# Patient Record
Sex: Male | Born: 1960 | Race: Black or African American | Hispanic: No | Marital: Married | Smoking: Never smoker
Health system: Southern US, Community
[De-identification: ages and names within clinical notes are randomized; demographics above are authoritative.]

## PROBLEM LIST (undated history)

## (undated) DIAGNOSIS — I10 Essential (primary) hypertension: Secondary | ICD-10-CM

## (undated) DIAGNOSIS — K635 Polyp of colon: Secondary | ICD-10-CM

## (undated) HISTORY — DX: Essential (primary) hypertension: I10

## (undated) HISTORY — PX: APPENDECTOMY: SHX54

## (undated) HISTORY — PX: SHOULDER SURGERY: SHX246

## (undated) HISTORY — PX: TONSILLECTOMY: SUR1361

## (undated) HISTORY — PX: VASECTOMY: SHX75

---

## 1998-12-02 HISTORY — PX: COLONOSCOPY: SHX174

## 2003-11-12 ENCOUNTER — Ambulatory Visit: Admission: RE | Admit: 2003-11-12 | Discharge: 2003-11-12 | Payer: Self-pay | Admitting: Internal Medicine

## 2004-01-14 ENCOUNTER — Ambulatory Visit (HOSPITAL_COMMUNITY): Admission: RE | Admit: 2004-01-14 | Discharge: 2004-01-14 | Payer: Self-pay | Admitting: Neurosurgery

## 2004-08-16 ENCOUNTER — Ambulatory Visit (HOSPITAL_COMMUNITY): Admission: RE | Admit: 2004-08-16 | Discharge: 2004-08-16 | Payer: Self-pay | Admitting: Family Medicine

## 2004-08-24 ENCOUNTER — Ambulatory Visit: Payer: Self-pay | Admitting: Orthopedic Surgery

## 2004-10-16 ENCOUNTER — Ambulatory Visit: Payer: Self-pay | Admitting: Orthopedic Surgery

## 2010-07-16 ENCOUNTER — Encounter: Payer: Self-pay | Admitting: Internal Medicine

## 2011-10-10 ENCOUNTER — Telehealth: Payer: Self-pay

## 2011-10-10 NOTE — Telephone Encounter (Signed)
Received copy of pt's previous colonoscopy report from [facility]. Done on 12/02/1998 by Dr. Jena Gauss. Tubular adenoma. OV with Tana Coast, PA on 10/17/2011 @ 8:00 AM.

## 2011-10-16 ENCOUNTER — Encounter: Payer: Self-pay | Admitting: Internal Medicine

## 2011-10-17 ENCOUNTER — Ambulatory Visit (INDEPENDENT_AMBULATORY_CARE_PROVIDER_SITE_OTHER): Payer: 59 | Admitting: Gastroenterology

## 2011-10-17 ENCOUNTER — Encounter: Payer: Self-pay | Admitting: Gastroenterology

## 2011-10-17 VITALS — BP 138/88 | HR 60 | Temp 98.0°F | Ht 71.0 in | Wt 219.6 lb

## 2011-10-17 DIAGNOSIS — D126 Benign neoplasm of colon, unspecified: Secondary | ICD-10-CM | POA: Insufficient documentation

## 2011-10-17 MED ORDER — PEG-KCL-NACL-NASULF-NA ASC-C 100 G PO SOLR: 1.0000 | Freq: Once | ORAL | Status: DC

## 2011-10-17 NOTE — Assessment & Plan Note (Signed)
Overdue for surveillance colonoscopy given history of tubular adenoma. Colonoscopy in the near future.  I have discussed the risks, alternatives, benefits with regards to but not limited to the risk of reaction to medication, bleeding, infection, perforation and the patient is agreeable to proceed. Written consent to be obtained.

## 2011-10-17 NOTE — Progress Notes (Signed)
Faxed to PCP

## 2011-10-17 NOTE — Progress Notes (Signed)
Primary Care Physician:  Kirk Ruths, MD, MD  Primary Gastroenterologist:  Roetta Sessions, MD   Chief Complaint  Patient presents with  . Colonoscopy    HPI:  Gregory Quinn is a 51 y.o. male here to schedule a surveillance colonoscopy. He has a history of a tubular adenoma removed back in 2000. Overall he is doing well. He has a history of airway bowel syndrome but states it has been under good control over the last several years with dietary restrictions. He denies any heartburn, vomiting, abdominal pain, weight loss, melena, rectal bleeding.  Current Outpatient Prescriptions  Medication Sig Dispense Refill  . bisoprolol-hydrochlorothiazide (ZIAC) 10-6.25 MG per tablet Take 1 tablet by mouth daily.      . Multiple Vitamin (MULTIVITAMIN) capsule Take 1 capsule by mouth daily.      . verapamil (CALAN-SR) 180 MG CR tablet Take 180 mg by mouth at bedtime.         Allergies as of 10/17/2011  . (No Known Allergies)    Past Medical History  Diagnosis Date  . HTN (hypertension)     Past Surgical History  Procedure Date  . Colonoscopy 12/02/98    normal rectum/single descending colonic diverticulum. Tubular adenoma of distal transverse colon.   . Vasectomy   . Tonsillectomy     Family History  Problem Relation Age of Onset  . Colon cancer Neg Hx   . Liver disease Neg Hx   . Emphysema Father     deceased  . Dementia Mother     History   Social History  . Marital Status: Married    Spouse Name: N/A    Number of Children: 4  . Years of Education: N/A   Occupational History  . police officer     [employer]   Social History Main Topics  . Smoking status: Never Smoker   . Smokeless tobacco: Not on file  . Alcohol Use: No  . Drug Use: No  . Sexually Active: Not on file   Other Topics Concern  . Not on file   Social History Narrative  . No narrative on file      ROS:  General: Negative for anorexia, weight loss, fever, chills, fatigue,  weakness. Eyes: Negative for vision changes.  ENT: Negative for hoarseness, difficulty swallowing , nasal congestion. CV: Negative for chest pain, angina, palpitations, dyspnea on exertion, peripheral edema.  Respiratory: Negative for dyspnea at rest, dyspnea on exertion, cough, sputum, wheezing.  GI: See history of present illness. GU:  Negative for dysuria, hematuria, urinary incontinence, urinary frequency, nocturnal urination.  MS: Negative for joint pain, low back pain.  Derm: Negative for rash or itching.  Neuro: Negative for weakness, abnormal sensation, seizure, frequent headaches, memory loss, confusion.  Psych: Negative for anxiety, depression, suicidal ideation, hallucinations.  Endo: Negative for unusual weight change.  Heme: Negative for bruising or bleeding. Allergy: Negative for rash or hives.    Physical Examination:  BP 138/88  Pulse 60  Temp(Src) 98 F (36.7 C) (Temporal)  Ht 5\' 11"  (1.803 m)  Wt 219 lb 9.6 oz (99.61 kg)  BMI 30.63 kg/m2   General: Well-nourished, well-developed in no acute distress.  Head: Normocephalic, atraumatic.   Eyes: Conjunctiva pink, no icterus. Mouth: Oropharyngeal mucosa moist and pink , no lesions erythema or exudate. Neck: Supple without thyromegaly, masses, or lymphadenopathy.  Lungs: Clear to auscultation bilaterally.  Heart: Regular rate and rhythm, no murmurs rubs or gallops.  Abdomen: Bowel sounds are normal, nontender, nondistended,  no hepatosplenomegaly or masses, no abdominal bruits or    hernia , no rebound or guarding.   Rectal: deferred to time of colonoscopy Extremities: No lower extremity edema. No clubbing or deformities.  Neuro: Alert and oriented x 4 , grossly normal neurologically.  Skin: Warm and dry, no rash or jaundice.   Psych: Alert and cooperative, normal mood and affect.

## 2011-10-17 NOTE — Patient Instructions (Signed)
We have scheduled you for a colonoscopy. See separate instructions.   

## 2011-10-26 ENCOUNTER — Encounter: Payer: Self-pay | Admitting: Internal Medicine

## 2011-11-09 ENCOUNTER — Encounter (HOSPITAL_COMMUNITY): Payer: Self-pay | Admitting: Pharmacy Technician

## 2011-11-15 MED ORDER — SODIUM CHLORIDE 0.45 % IV SOLN
Freq: Once | INTRAVENOUS | Status: AC
Start: 2011-11-15 — End: 2011-11-16
  Administered 2011-11-16: 08:00:00 via INTRAVENOUS

## 2011-11-16 ENCOUNTER — Encounter (HOSPITAL_COMMUNITY)
Admission: RE | Disposition: A | Discharge status: Home / Self Care | Payer: Self-pay | Source: Ambulatory Visit | Attending: Internal Medicine

## 2011-11-16 ENCOUNTER — Encounter (HOSPITAL_COMMUNITY): Payer: Self-pay | Admitting: *Deleted

## 2011-11-16 ENCOUNTER — Ambulatory Visit (HOSPITAL_COMMUNITY)
Admission: RE | Admit: 2011-11-16 | Discharge: 2011-11-16 | Disposition: A | Discharge status: Home / Self Care | Payer: 59 | Source: Ambulatory Visit | Attending: Internal Medicine | Admitting: Internal Medicine

## 2011-11-16 DIAGNOSIS — D126 Benign neoplasm of colon, unspecified: Secondary | ICD-10-CM

## 2011-11-16 DIAGNOSIS — Z1211 Encounter for screening for malignant neoplasm of colon: Secondary | ICD-10-CM

## 2011-11-16 DIAGNOSIS — Z09 Encounter for follow-up examination after completed treatment for conditions other than malignant neoplasm: Secondary | ICD-10-CM | POA: Insufficient documentation

## 2011-11-16 DIAGNOSIS — I10 Essential (primary) hypertension: Secondary | ICD-10-CM | POA: Insufficient documentation

## 2011-11-16 DIAGNOSIS — Z79899 Other long term (current) drug therapy: Secondary | ICD-10-CM | POA: Insufficient documentation

## 2011-11-16 HISTORY — PX: COLONOSCOPY: SHX5424

## 2011-11-16 HISTORY — DX: Polyp of colon: K63.5

## 2011-11-16 SURGERY — COLONOSCOPY
Anesthesia: Moderate Sedation

## 2011-11-16 MED ORDER — MIDAZOLAM HCL 5 MG/5ML IJ SOLN
INTRAMUSCULAR | Status: AC
  Filled 2011-11-16: qty 10

## 2011-11-16 MED ORDER — STERILE WATER FOR IRRIGATION IR SOLN
Status: DC | PRN
  Administered 2011-11-16: 10:00:00

## 2011-11-16 MED ORDER — MIDAZOLAM HCL 5 MG/5ML IJ SOLN
INTRAMUSCULAR | Status: DC | PRN
  Administered 2011-11-16: 1 mg via INTRAVENOUS
  Administered 2011-11-16 (×2): 2 mg via INTRAVENOUS

## 2011-11-16 MED ORDER — MEPERIDINE HCL 100 MG/ML IJ SOLN
INTRAMUSCULAR | Status: AC
  Filled 2011-11-16: qty 1

## 2011-11-16 MED ORDER — MEPERIDINE HCL 100 MG/ML IJ SOLN
INTRAMUSCULAR | Status: DC | PRN
  Administered 2011-11-16: 25 mg via INTRAVENOUS
  Administered 2011-11-16: 50 mg via INTRAVENOUS

## 2011-11-16 NOTE — Op Note (Signed)
[facility] [address] [place], [place]  [address]  COLONOSCOPY PROCEDURE REPORT  PATIENT:  Gregory, Quinn  MR#:  [id number] BIRTHDATE:  [birth year], 51 yrs. old  GENDER:  male ENDOSCOPIST:  R. Roetta Sessions, MD FACP [facility] REF. BY:  Karleen Hampshire, M.D. PROCEDURE DATE:  11/16/2011 PROCEDURE:  Colonoscopy with snare polypectomy  INDICATIONS:  Distant history of colonic adenoma; overdue for surveillance examination.  INFORMED CONSENT:  The risks, benefits, alternatives and imponderables including but not limited to bleeding, perforation as well as the possibility of a missed lesion have been reviewed. The potential for biopsy, lesion removal, etc. have also been discussed.  Questions have been answered.  All parties agreeable. Please see the history and physical in the medical record for more information.  MEDICATIONS:  Versed 5 mg IV and Demerol 75 mg IV in divided doses.  DESCRIPTION OF PROCEDURE:  After a digital rectal exam was performed, the EC-[id number] ([id number]) colonoscope was advanced from the anus through the rectum and colon to the area of the cecum, ileocecal valve and appendiceal orifice.  The cecum was deeply intubated.  These structures were well-seen and photographed for the record.  From the level of the cecum and ileocecal valve, the scope was slowly and cautiously withdrawn.  The mucosal surfaces were carefully surveyed utilizing scope tip deflection to facilitate fold flattening as needed.  The scope was pulled down into the rectum where a thorough examination including retroflexion was performed. <<PROCEDUREIMAGES>>  FINDINGS: The preparation. Normal rectum. 1.2 cm pedunculated polyp in the mid sigmoid colon; otherwise the remainder of colonic mucosa                      appeared normal.  THERAPEUTIC / DIAGNOSTIC MANEUVERS PERFORMED: The above-mentioned polyp was hot snared and removed cleanly with one pass.  COMPLICATIONS:  None  CECAL  WITHDRAWAL TIME:   11 minutes  IMPRESSION:   Colonic polyp-removed as described above  RECOMMENDATIONS:  Followup on pathology  ______________________________ R. Roetta Sessions, MD Caleen Essex  CC:  Karleen Hampshire, M.D.  n. eSIGNED:   R. Roetta Sessions at 11/16/2011 10:02 AM  Peyton Najjar, [phone]

## 2011-11-16 NOTE — H&P (View-Only) (Signed)
Primary Care Physician:  MCGOUGH,WILLIAM M, MD, MD  Primary Gastroenterologist:  Michael Rourk, MD   Chief Complaint  Patient presents with  . Colonoscopy    HPI:  Gregory Quinn is a 51 y.o. male here to schedule a surveillance colonoscopy. He has a history of a tubular adenoma removed back in 2000. Overall he is doing well. He has a history of airway bowel syndrome but states it has been under good control over the last several years with dietary restrictions. He denies any heartburn, vomiting, abdominal pain, weight loss, melena, rectal bleeding.  Current Outpatient Prescriptions  Medication Sig Dispense Refill  . bisoprolol-hydrochlorothiazide (ZIAC) 10-6.25 MG per tablet Take 1 tablet by mouth daily.      . Multiple Vitamin (MULTIVITAMIN) capsule Take 1 capsule by mouth daily.      . verapamil (CALAN-SR) 180 MG CR tablet Take 180 mg by mouth at bedtime.         Allergies as of 10/17/2011  . (No Known Allergies)    Past Medical History  Diagnosis Date  . HTN (hypertension)     Past Surgical History  Procedure Date  . Colonoscopy 12/02/98    normal rectum/single descending colonic diverticulum. Tubular adenoma of distal transverse colon.   . Vasectomy   . Tonsillectomy     Family History  Problem Relation Age of Onset  . Colon cancer Neg Hx   . Liver disease Neg Hx   . Emphysema Father     deceased  . Dementia Mother     History   Social History  . Marital Status: Married    Spouse Name: N/A    Number of Children: 4  . Years of Education: N/A   Occupational History  . police officer     [employer]   Social History Main Topics  . Smoking status: Never Smoker   . Smokeless tobacco: Not on file  . Alcohol Use: No  . Drug Use: No  . Sexually Active: Not on file   Other Topics Concern  . Not on file   Social History Narrative  . No narrative on file      ROS:  General: Negative for anorexia, weight loss, fever, chills, fatigue,  weakness. Eyes: Negative for vision changes.  ENT: Negative for hoarseness, difficulty swallowing , nasal congestion. CV: Negative for chest pain, angina, palpitations, dyspnea on exertion, peripheral edema.  Respiratory: Negative for dyspnea at rest, dyspnea on exertion, cough, sputum, wheezing.  GI: See history of present illness. GU:  Negative for dysuria, hematuria, urinary incontinence, urinary frequency, nocturnal urination.  MS: Negative for joint pain, low back pain.  Derm: Negative for rash or itching.  Neuro: Negative for weakness, abnormal sensation, seizure, frequent headaches, memory loss, confusion.  Psych: Negative for anxiety, depression, suicidal ideation, hallucinations.  Endo: Negative for unusual weight change.  Heme: Negative for bruising or bleeding. Allergy: Negative for rash or hives.    Physical Examination:  BP 138/88  Pulse 60  Temp(Src) 98 F (36.7 C) (Temporal)  Ht 5' 11" (1.803 m)  Wt 219 lb 9.6 oz (99.61 kg)  BMI 30.63 kg/m2   General: Well-nourished, well-developed in no acute distress.  Head: Normocephalic, atraumatic.   Eyes: Conjunctiva pink, no icterus. Mouth: Oropharyngeal mucosa moist and pink , no lesions erythema or exudate. Neck: Supple without thyromegaly, masses, or lymphadenopathy.  Lungs: Clear to auscultation bilaterally.  Heart: Regular rate and rhythm, no murmurs rubs or gallops.  Abdomen: Bowel sounds are normal, nontender, nondistended,   no hepatosplenomegaly or masses, no abdominal bruits or    hernia , no rebound or guarding.   Rectal: deferred to time of colonoscopy Extremities: No lower extremity edema. No clubbing or deformities.  Neuro: Alert and oriented x 4 , grossly normal neurologically.  Skin: Warm and dry, no rash or jaundice.   Psych: Alert and cooperative, normal mood and affect.   

## 2011-11-16 NOTE — Discharge Instructions (Addendum)
Colonoscopy Discharge Instructions  Read the instructions outlined below and refer to this sheet in the next few weeks. These discharge instructions provide you with general information on caring for yourself after you leave the hospital. Your doctor may also give you specific instructions. While your treatment has been planned according to the most current medical practices available, unavoidable complications occasionally occur. If you have any problems or questions after discharge, call Dr. Rourk at [phone]. ACTIVITY  You may resume your regular activity, but move at a slower pace for the next 24 hours.   Take frequent rest periods for the next 24 hours.   Walking will help get rid of the air and reduce the bloated feeling in your belly (abdomen).   No driving for 24 hours (because of the medicine (anesthesia) used during the test).    Do not sign any important legal documents or operate any machinery for 24 hours (because of the anesthesia used during the test).  NUTRITION  Drink plenty of fluids.   You may resume your normal diet as instructed by your doctor.   Begin with a light meal and progress to your normal diet. Heavy or fried foods are harder to digest and may make you feel sick to your stomach (nauseated).   Avoid alcoholic beverages for 24 hours or as instructed.  MEDICATIONS  You may resume your normal medications unless your doctor tells you otherwise.  WHAT YOU CAN EXPECT TODAY  Some feelings of bloating in the abdomen.   Passage of more gas than usual.   Spotting of blood in your stool or on the toilet paper.  IF YOU HAD POLYPS REMOVED DURING THE COLONOSCOPY:  No aspirin products for 7 days or as instructed.   No alcohol for 7 days or as instructed.   Eat a soft diet for the next 24 hours.  FINDING OUT THE RESULTS OF YOUR TEST Not all test results are available during your visit. If your test results are not back during the visit, make an appointment  with your caregiver to find out the results. Do not assume everything is normal if you have not heard from your caregiver or the medical facility. It is important for you to follow up on all of your test results.  SEEK IMMEDIATE MEDICAL ATTENTION IF:  You have more than a spotting of blood in your stool.   Your belly is swollen (abdominal distention).   You are nauseated or vomiting.   You have a temperature over 101.   You have abdominal pain or discomfort that is severe or gets worse throughout the day.    Polyp information provided.  Further recommendations to follow pending review of pathology report.  Colon Polyps A polyp is extra tissue that grows inside your body. Colon polyps grow in the large intestine. The large intestine, also called the colon, is part of your digestive system. It is a long, hollow tube at the end of your digestive tract where your body makes and stores stool. Most polyps are not dangerous. They are benign. This means they are not cancerous. But over time, some types of polyps can turn into cancer. Polyps that are smaller than a pea are usually not harmful. But larger polyps could someday become or may already be cancerous. To be safe, doctors remove all polyps and test them.  WHO GETS POLYPS? Anyone can get polyps, but certain people are more likely than others. You may have a greater chance of getting polyps if:    You are over 50.   You have had polyps before.   Someone in your family has had polyps.   Someone in your family has had cancer of the large intestine.   Find out if someone in your family has had polyps. You may also be more likely to get polyps if you:   Eat a lot of fatty foods.   Smoke.   Drink alcohol.   Do not exercise.   Eat too much.  SYMPTOMS  Most small polyps do not cause symptoms. People often do not know they have one until their caregiver finds it during a regular checkup or while testing them for something else. Some  people do have symptoms like these:  Bleeding from the anus. You might notice blood on your underwear or on toilet paper after you have had a bowel movement.   Constipation or diarrhea that lasts more than a week.   Blood in the stool. Blood can make stool look black or it can show up as red streaks in the stool.  If you have any of these symptoms, see your caregiver. HOW DOES THE DOCTOR TEST FOR POLYPS? The doctor can use four tests to check for polyps:  Digital rectal exam. The caregiver wears gloves and checks your rectum (the last part of the large intestine) to see if it feels normal. This test would find polyps only in the rectum. Your caregiver may need to do one of the other tests listed below to find polyps higher up in the intestine.   Barium enema. The caregiver puts a liquid called barium into your rectum before taking x-rays of your large intestine. Barium makes your intestine look white in the pictures. Polyps are dark, so they are easy to see.   Sigmoidoscopy. With this test, the caregiver can see inside your large intestine. A thin flexible tube is placed into your rectum. The device is called a sigmoidoscope, which has a light and a tiny video camera in it. The caregiver uses the sigmoidoscope to look at the last third of your large intestine.   Colonoscopy. This test is like sigmoidoscopy, but the caregiver looks at all of the large intestine. It usually requires sedation. This is the most common method for finding and removing polyps.  TREATMENT   The caregiver will remove the polyp during sigmoidoscopy or colonoscopy. The polyp is then tested for cancer.   If you have had polyps, your caregiver may want you to get tested regularly in the future.  PREVENTION  There is not one sure way to prevent polyps. You might be able to lower your risk of getting them if you:  Eat more fruits and vegetables and less fatty food.   Do not smoke.   Avoid alcohol.   Exercise every  day.   Lose weight if you are overweight.   Eating more calcium and folate can also lower your risk of getting polyps. Some foods that are rich in calcium are milk, cheese, and broccoli. Some foods that are rich in folate are chickpeas, kidney beans, and spinach.   Aspirin might help prevent polyps. Studies are under way.  Document Released: 03/07/2004 Document Revised: 05/31/2011 Document Reviewed: 08/13/2007 [employer] Patient Information 2012 [employer]. 

## 2011-11-16 NOTE — Interval H&P Note (Signed)
History and Physical Interval Note:  11/16/2011 9:29 AM  Gregory Quinn  has presented today for surgery, with the diagnosis of hx of polyps  The various methods of treatment have been discussed with the patient and family. After consideration of risks, benefits and other options for treatment, the patient has consented to  Procedure(s) (LRB): COLONOSCOPY (N/A) as a surgical intervention .  The patients' history has been reviewed, patient examined, no change in status, stable for surgery.  I have reviewed the patients' chart and labs.  Questions were answered to the patient's satisfaction.     [name]

## 2011-11-21 ENCOUNTER — Encounter (HOSPITAL_COMMUNITY): Payer: Self-pay | Admitting: Internal Medicine

## 2011-11-24 ENCOUNTER — Encounter: Payer: Self-pay | Admitting: Internal Medicine

## 2014-01-07 ENCOUNTER — Ambulatory Visit: Payer: Self-pay

## 2014-01-07 ENCOUNTER — Other Ambulatory Visit: Payer: Self-pay | Admitting: Occupational Medicine

## 2014-01-07 DIAGNOSIS — R52 Pain, unspecified: Secondary | ICD-10-CM

## 2014-11-09 ENCOUNTER — Encounter: Payer: Self-pay | Admitting: Internal Medicine

## 2020-07-31 ENCOUNTER — Other Ambulatory Visit: Payer: Self-pay

## 2020-07-31 ENCOUNTER — Emergency Department (HOSPITAL_COMMUNITY): Payer: 59

## 2020-07-31 ENCOUNTER — Encounter (HOSPITAL_COMMUNITY): Payer: Self-pay | Admitting: *Deleted

## 2020-07-31 ENCOUNTER — Emergency Department (HOSPITAL_COMMUNITY)
Admission: EM | Admit: 2020-07-31 | Discharge: 2020-08-01 | Disposition: A | Discharge status: Home / Self Care | Payer: 59 | Attending: Emergency Medicine | Admitting: Emergency Medicine

## 2020-07-31 DIAGNOSIS — M79604 Pain in right leg: Secondary | ICD-10-CM | POA: Insufficient documentation

## 2020-07-31 DIAGNOSIS — R079 Chest pain, unspecified: Secondary | ICD-10-CM

## 2020-07-31 DIAGNOSIS — I10 Essential (primary) hypertension: Secondary | ICD-10-CM | POA: Insufficient documentation

## 2020-07-31 DIAGNOSIS — R0789 Other chest pain: Secondary | ICD-10-CM | POA: Diagnosis not present

## 2020-07-31 DIAGNOSIS — M79605 Pain in left leg: Secondary | ICD-10-CM | POA: Insufficient documentation

## 2020-07-31 LAB — CBC
HCT: 46.2 % (ref 39.0–52.0)
Hemoglobin: 14.6 g/dL (ref 13.0–17.0)
MCH: 30.5 pg (ref 26.0–34.0)
MCHC: 31.6 g/dL (ref 30.0–36.0)
MCV: 96.5 fL (ref 80.0–100.0)
Platelets: 194 10*3/uL (ref 150–400)
RBC: 4.79 MIL/uL (ref 4.22–5.81)
RDW: 13.1 % (ref 11.5–15.5)
WBC: 6.3 10*3/uL (ref 4.0–10.5)
nRBC: 0 % (ref 0.0–0.2)

## 2020-07-31 LAB — BASIC METABOLIC PANEL
Anion gap: 8 (ref 5–15)
BUN: 11 mg/dL (ref 6–20)
CO2: 29 mmol/L (ref 22–32)
Calcium: 9.2 mg/dL (ref 8.9–10.3)
Chloride: 104 mmol/L (ref 98–111)
Creatinine, Ser: 1.09 mg/dL (ref 0.61–1.24)
GFR, Estimated: >60 mL/min (ref >60)
Glucose, Bld: 115 mg/dL — ABNORMAL HIGH (ref 70–99)
Potassium: 3.5 mmol/L (ref 3.5–5.1)
Sodium: 141 mmol/L (ref 135–145)

## 2020-07-31 LAB — TROPONIN I (HIGH SENSITIVITY): Troponin I (High Sensitivity): 3 ng/L (ref <18)

## 2020-07-31 NOTE — ED Notes (Signed)
During assessment with Pt, Pt reports removing flooring on Thursday this week with a friend and feeling sore afterwards. Pt also reports lift heavy equipment and air compressor into back of truck later in even as well. Pt denies any other injury or hearing a pop. Pt has Hx of left shoulder surgery and reports his pain is currently localized to left pectoralis majoris. Pain worsens with movement of LUE.

## 2020-07-31 NOTE — ED Triage Notes (Signed)
Pt with left sided cp since Thursday, pt states a lot more spasms now. Pt believes it's a pulled a muscle.  Pt was taking up a floor when pain started.

## 2020-08-01 LAB — TROPONIN I (HIGH SENSITIVITY): Troponin I (High Sensitivity): 3 ng/L (ref <18)

## 2020-08-01 MED ORDER — KETOROLAC TROMETHAMINE 30 MG/ML IJ SOLN
15.0000 mg | Freq: Once | INTRAMUSCULAR | Status: AC
  Administered 2020-08-01: 15 mg via INTRAVENOUS
  Filled 2020-08-01: qty 1

## 2020-08-01 MED ORDER — CYCLOBENZAPRINE HCL 10 MG PO TABS: 10.0000 mg | ORAL_TABLET | Freq: Two times a day (BID) | ORAL | 0 refills | Status: DC | PRN

## 2020-08-01 MED ORDER — IBUPROFEN 400 MG PO TABS: 400.0000 mg | ORAL_TABLET | Freq: Three times a day (TID) | ORAL | 0 refills | Status: AC

## 2020-08-01 MED ORDER — DIAZEPAM 2 MG PO TABS
2.0000 mg | ORAL_TABLET | Freq: Once | ORAL | Status: AC
  Administered 2020-08-01: 2 mg via ORAL
  Filled 2020-08-01: qty 1

## 2020-08-01 NOTE — ED Provider Notes (Signed)
Emergency Department Provider Note  I have reviewed the triage vital signs and the nursing notes.  HISTORY  Chief Complaint Chest Pain   HPI Gregory Quinn is a 60 y.o. male with a history of hypertension but no other medical problems presents the emergency department today with chest pain.  Patient relates that he was pulling a bunch of floor and lifting heavy things a few days ago.  Shortly after this he started having some bilateral lower extremity pain and chest pain.  He describes both of them as spasms and cramps.  He states he does not normally do much physical activity.  Patient now is having some central to slightly to the left chest pain worse with movement, palpation and movement of his arms.  No shortness of breath, nausea, lightheadedness or diaphoresis.  Patient states that when he lays still he has no symptoms at all but since he starts to move it hurts again.  No lower extremity swelling.  No fever cough.  Has not tried any for the symptoms.   No other associated or modifying symptoms.    Past Medical History:  Diagnosis Date  . Colon polyps   . HTN (hypertension)     Patient Active Problem List   Diagnosis Date Noted  . Tubular adenoma of colon 10/17/2011    Past Surgical History:  Procedure Laterality Date  . APPENDECTOMY    . COLONOSCOPY  12/02/98   normal rectum/single descending colonic diverticulum. Tubular adenoma of distal transverse colon.   . COLONOSCOPY  11/16/2011   Procedure: COLONOSCOPY;  Surgeon: Daneil Dolin, MD;  Location: [facility];  Service: Endoscopy;  Laterality: N/A;  9:15  . SHOULDER SURGERY    . TONSILLECTOMY    . VASECTOMY      Current Outpatient Rx  . Order #: [id number] Class: Print  . Order #: [id number] Class: Print  . Order #: [id number] Class: Historical Med  . Order #: [id number] Class: Historical Med  . Order #: [id number] Class: Print  . Order #: [id number] Class: Historical Med    Allergies Patient has no known  allergies.  Family History  Problem Relation Age of Onset  . Colon cancer Neg Hx   . Liver disease Neg Hx   . Emphysema Father        deceased  . Dementia Mother     Social History Social History   Tobacco Use  . Smoking status: Never Smoker  . Smokeless tobacco: Never Used  Substance Use Topics  . Alcohol use: No  . Drug use: No    Review of Systems  All other systems negative except as documented in the HPI. All pertinent positives and negatives as reviewed in the HPI. ____________________________________________  PHYSICAL EXAM:  VITAL SIGNS: ED Triage Vitals  Enc Vitals Group     BP 07/31/20 2259 (!) 158/81     Pulse Rate 07/31/20 2259 75     Resp 07/31/20 2259 12     Temp 07/31/20 2259 98.3 F (36.8 C)     Temp Source 07/31/20 2259 Oral     SpO2 07/31/20 2259 97 %     Weight 07/31/20 2255 228 lb (103.4 kg)     Height 07/31/20 2255 5\' 11"  (1.803 m)    Constitutional: Alert and oriented. Well appearing and in no acute distress. Eyes: Conjunctivae are normal. PERRL. EOMI. Head: Atraumatic. Nose: No congestion/rhinnorhea. Mouth/Throat: Mucous membranes are moist.  Oropharynx non-erythematous. Neck: No stridor.  No meningeal signs.   Cardiovascular: Normal rate, regular  rhythm. Good peripheral circulation. Grossly normal heart sounds.   Respiratory: Normal respiratory effort.  No retractions. Lungs CTAB. Gastrointestinal: Soft and nontender. No distention.  Musculoskeletal: No lower extremity tenderness nor edema. No gross deformities of extremities.  Tenderness over left pectoral and sternum. Neurologic:  Normal speech and language. No gross focal neurologic deficits are appreciated.  Skin:  Skin is warm, dry and intact. No rash noted.  ____________________________________________   LABS (all labs ordered are listed, but only abnormal results are displayed)  Labs Reviewed  BASIC METABOLIC PANEL - Abnormal; Notable for the following components:       Result Value   Glucose, Bld 115 (*)    All other components within normal limits  CBC  TROPONIN I (HIGH SENSITIVITY)  TROPONIN I (HIGH SENSITIVITY)   ____________________________________________  EKG   EKG Interpretation  Date/Time:  Sunday July 31 2020 22:49:46 EST Ventricular Rate:  76 PR Interval:  172 QRS Duration: 88 QT Interval:  372 QTC Calculation: 418 R Axis:   -140 Text Interpretation: Normal sinus rhythm Right superior axis deviation Possible Right ventricular hypertrophy Abnormal ECG No old tracing to compare Confirmed by Merrily Pew [phone]) on 08/01/2020 12:28:46 AM      ____________________________________________  RADIOLOGY  DG Chest Portable 1 View  Result Date: 07/31/2020 CLINICAL DATA:  Chest pain since Thursday lesions pole muscle. EXAM: PORTABLE CHEST 1 VIEW COMPARISON:  Chest radiograph December 22, 2016 FINDINGS: The heart size and mediastinal contours are within normal limits. Linear band in the mid right lung and left lower lung, likely subsegmental atelectasis. No focal consolidation. No pleural effusion. No pneumothorax. The visualized skeletal structures are unremarkable. IMPRESSION: 1. Minimal subsegmental atelectasis versus scarring in the mid right lung and left lower lung. 2. No acute cardiopulmonary abnormality. Electronically Signed   By: Dahlia Bailiff MD   On: 07/31/2020 23:56   ____________________________________________  PROCEDURES  Procedure(s) performed:   Procedures ____________________________________________  INITIAL IMPRESSION / ASSESSMENT AND PLAN    This patient presents to the ED for concern of chest pain, this involves an extensive number of treatment options, and is a complaint that carries with it a high risk of complications and morbidity.  The initial differential diagnosis includes ACS, PE, pneumonia, muscle spasm, costochondritis.  I suspect that it is muscle strain/spasm as the most likely cause with the movement  exacerbating pain.  However he is 60 years old history of hypertension so we will do delta troponins.  Additional history obtained:   Additional history obtained from his wife at bedside  Previous records obtained and reviewed in epic  ED Course  Clinical Course as of 08/01/20 0452  [facility] Aug 01, 2020  0038 Troponin I (High Sensitivity): 3 Reassuring, pending second [JM]  0038 WBC: 6.3 [JM]  0038 BP(!): 158/83 [JM]  0038 BP(!): 158/81 [JM]  0039 DG Chest Portable 1 View Doubt pneumonia, covid, pneumothorax, occult rib fx possible? [JM]  0227 Patient sleeping. Feels much better. Will await second troponin.  [JM]    Clinical Course User Index [JM] Armany Mano, Corene Cornea, MD     Medicines ordered:  Medications  ketorolac (TORADOL) 30 MG/ML injection 15 mg (15 mg Intravenous Given 08/01/20 0053)  diazepam (VALIUM) tablet 2 mg (2 mg Oral Given 08/01/20 0053)    Consultations Obtained:   I consulted noone  and discussed lab and imaging findings   CRITICAL INTERVENTIONS:  . none  Reevaluation:  After the interventions stated above, I reevaluated the patient and found pain free, no  new symptoms. Stable for discharge for suspected msk strain. pcp follow up.   FINAL IMPRESSION AND PLAN Final diagnoses:  Nonspecific chest pain   A medical screening exam was performed and I feel the patient has had an appropriate workup for their chief complaint at this time and likelihood of emergent condition existing is low. They have been counseled on decision, DISCHARGE, follow up and which symptoms necessitate immediate return to the emergency department. They or their family verbally stated understanding and agreement with plan and discharged in stable condition.   ____________________________________________   NEW OUTPATIENT MEDICATIONS STARTED DURING THIS VISIT:  Discharge Medication List as of 08/01/2020  2:42 AM    START taking these medications   Details  cyclobenzaprine (FLEXERIL) 10 MG  tablet Take 1 tablet (10 mg total) by mouth 2 (two) times daily as needed for muscle spasms., Starting Mon 08/01/2020, Print    ibuprofen (ADVIL) 400 MG tablet Take 1 tablet (400 mg total) by mouth 3 (three) times daily for 7 days., Starting Mon 08/01/2020, Until Mon 08/08/2020, Print        Note:  This note was prepared with assistance of Dragon voice recognition software. Occasional wrong-word or sound-a-like substitutions may have occurred due to the inherent limitations of voice recognition software.   Renuka Farfan, Corene Cornea, MD 08/01/20 [phone]

## 2021-07-12 IMAGING — DX DG CHEST 1V PORT
1 series · 1 of 1 positions shown · non-contrast
Comparison: Chest radiograph December 22, 2016

CLINICAL DATA: Chest pain since [REDACTED] lesions pole muscle.

EXAM:
PORTABLE CHEST 1 VIEW

[chest ap]
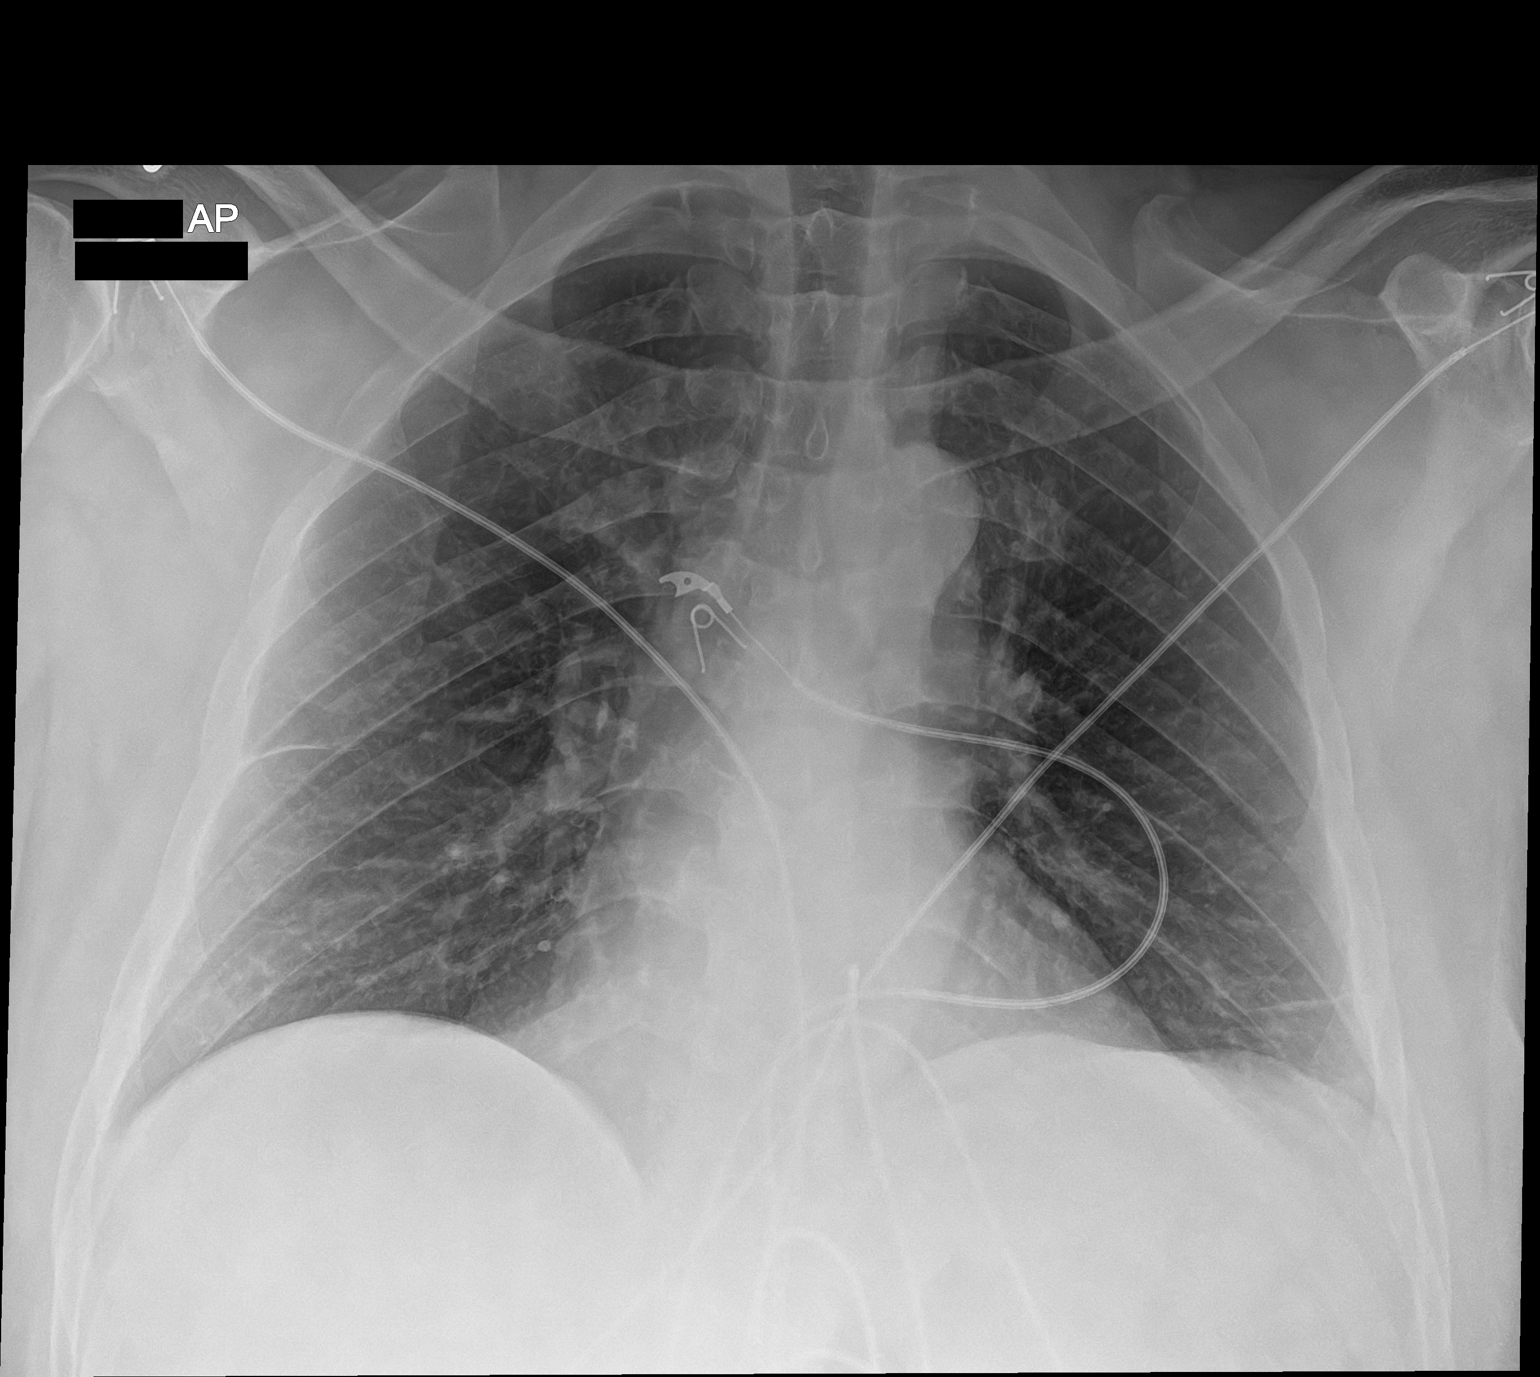

[1 of 1 positions shown; findings below may reference images not displayed]

FINDINGS: The heart size and mediastinal contours are within normal limits.
Linear band in the mid right lung and left lower lung, likely
subsegmental atelectasis. No focal consolidation. No pleural
effusion. No pneumothorax. The visualized skeletal structures are
unremarkable.
IMPRESSION: 1. Minimal subsegmental atelectasis versus scarring in the mid right
lung and left lower lung.
2. No acute cardiopulmonary abnormality.

## 2023-01-28 ENCOUNTER — Encounter: Payer: Self-pay | Admitting: *Deleted

## 2023-02-07 ENCOUNTER — Telehealth: Payer: Self-pay | Admitting: Internal Medicine

## 2023-02-07 DIAGNOSIS — Z1211 Encounter for screening for malignant neoplasm of colon: Secondary | ICD-10-CM

## 2023-02-07 NOTE — Telephone Encounter (Signed)
Questionnaire is in review.

## 2023-02-08 NOTE — Addendum Note (Signed)
Addended by: Armstead Peaks on: 02/08/2023 08:39 AM   Modules accepted: Orders

## 2023-02-08 NOTE — Telephone Encounter (Signed)
  Procedure: Colonoscopy  Height: 5'11 Weight: 234lbs       Have you had a colonoscopy before?  Dr. Jena Gauss, 2013  Do you have family history of colon cancer?  no  Do you have a family history of polyps? no  Previous colonoscopy with polyps removed? no  Do you have a history colorectal cancer?   no  Are you diabetic?  no  Do you have a prosthetic or mechanical heart valve? no  Do you have a pacemaker/defibrillator?   no  Have you had endocarditis/atrial fibrillation?  no  Do you use supplemental oxygen/CPAP?  yes cpap  Have you had joint replacement within the last 12 months?  no  Do you tend to be constipated or have to use laxatives?  no   Do you have history of alcohol use? If yes, how much and how often.  no  Do you have history or are you using drugs? If yes, what do are you  using?  no  Have you ever had a stroke/heart attack?  no  Have you ever had a heart or other vascular stent placed,?no  Do you take weight loss medication? no  Do you take any blood-thinning medications such as: (Plavix, aspirin, Coumadin, Aggrenox, Brilinta, Xarelto, Eliquis, Pradaxa, Savaysa or Effient)? no  If yes we need the name, milligram, dosage and who is prescribing doctor:               Current Outpatient Medications  Medication Sig Dispense Refill   amLODipine (NORVASC) 10 MG tablet Take 10 mg by mouth daily.     aspirin EC 81 MG tablet Take 81 mg by mouth daily. Swallow whole.     atorvastatin (LIPITOR) 10 MG tablet Take 10 mg by mouth at bedtime.     bisoprolol-hydrochlorothiazide (ZIAC) 10-6.25 MG per tablet Take 1 tablet by mouth daily.     Cholecalciferol (VITAMIN D) 125 MCG (5000 UT) CAPS Take by mouth daily.     ferrous sulfate 325 (65 FE) MG EC tablet Take 325 mg by mouth daily with breakfast.     Glucosamine-Chondroit-Vit C-Mn (GLUCOSAMINE 1500 COMPLEX PO) Take by mouth. 2 daily     Multiple Vitamins-Minerals (CENTRUM SILVER PO) Take by mouth daily.     omega-3 acid  ethyl esters (LOVAZA) 1 g capsule Take by mouth 2 (two) times daily.     No current facility-administered medications for this visit.    Allergies  Allergen Reactions   Oxycodone

## 2023-03-18 MED ORDER — PEG 3350-KCL-NA BICARB-NACL 420 G PO SOLR: 4000.0000 mL | Freq: Once | ORAL | 0 refills | Status: AC

## 2023-03-18 NOTE — Telephone Encounter (Signed)
Spoke with pt. Scheduled for 10/10. Aware will send instructions. Aware to hold iron x 7 days prior. Bmet entered. Rx for prep sent.

## 2023-03-18 NOTE — Addendum Note (Signed)
Addended by: Armstead Peaks on: 03/18/2023 08:10 AM   Modules accepted: Orders

## 2023-03-25 ENCOUNTER — Other Ambulatory Visit (HOSPITAL_COMMUNITY)
Admission: RE | Admit: 2023-03-25 | Discharge: 2023-03-25 | Disposition: A | Discharge status: Home / Self Care | Payer: 59 | Source: Ambulatory Visit | Attending: Internal Medicine | Admitting: Internal Medicine

## 2023-03-25 DIAGNOSIS — Z1211 Encounter for screening for malignant neoplasm of colon: Secondary | ICD-10-CM | POA: Insufficient documentation

## 2023-03-25 LAB — BASIC METABOLIC PANEL
Anion gap: 10 (ref 5–15)
BUN: 11 mg/dL (ref 8–23)
CO2: 28 mmol/L (ref 22–32)
Calcium: 8.9 mg/dL (ref 8.9–10.3)
Chloride: 101 mmol/L (ref 98–111)
Creatinine, Ser: 1.1 mg/dL (ref 0.61–1.24)
GFR, Estimated: >60 mL/min (ref >60)
Glucose, Bld: 99 mg/dL (ref 70–99)
Potassium: 3.7 mmol/L (ref 3.5–5.1)
Sodium: 139 mmol/L (ref 135–145)

## 2023-03-26 ENCOUNTER — Encounter: Payer: Self-pay | Admitting: *Deleted

## 2023-03-26 NOTE — Telephone Encounter (Signed)
Referral completed, [facility] apt letter sent to PCP

## 2023-04-04 ENCOUNTER — Ambulatory Visit (HOSPITAL_COMMUNITY): Payer: 59 | Admitting: Anesthesiology

## 2023-04-04 ENCOUNTER — Encounter (HOSPITAL_COMMUNITY): Payer: Self-pay | Admitting: Internal Medicine

## 2023-04-04 ENCOUNTER — Ambulatory Visit (HOSPITAL_COMMUNITY)
Admission: RE | Admit: 2023-04-04 | Discharge: 2023-04-04 | Disposition: A | Discharge status: Home / Self Care | Payer: 59 | Attending: Internal Medicine | Admitting: Internal Medicine

## 2023-04-04 ENCOUNTER — Encounter (HOSPITAL_COMMUNITY)
Admission: RE | Disposition: A | Discharge status: Home / Self Care | Payer: Self-pay | Source: Home / Self Care | Attending: Internal Medicine

## 2023-04-04 ENCOUNTER — Other Ambulatory Visit: Payer: Self-pay

## 2023-04-04 ENCOUNTER — Ambulatory Visit (HOSPITAL_BASED_OUTPATIENT_CLINIC_OR_DEPARTMENT_OTHER): Payer: 59 | Admitting: Anesthesiology

## 2023-04-04 DIAGNOSIS — I10 Essential (primary) hypertension: Secondary | ICD-10-CM | POA: Insufficient documentation

## 2023-04-04 DIAGNOSIS — Z860100 Personal history of colon polyps, unspecified: Secondary | ICD-10-CM | POA: Insufficient documentation

## 2023-04-04 DIAGNOSIS — K649 Unspecified hemorrhoids: Secondary | ICD-10-CM | POA: Diagnosis not present

## 2023-04-04 DIAGNOSIS — Z1211 Encounter for screening for malignant neoplasm of colon: Secondary | ICD-10-CM | POA: Diagnosis present

## 2023-04-04 DIAGNOSIS — D124 Benign neoplasm of descending colon: Secondary | ICD-10-CM | POA: Insufficient documentation

## 2023-04-04 DIAGNOSIS — K635 Polyp of colon: Secondary | ICD-10-CM

## 2023-04-04 DIAGNOSIS — Z860101 Personal history of adenomatous and serrated colon polyps: Secondary | ICD-10-CM

## 2023-04-04 DIAGNOSIS — K640 First degree hemorrhoids: Secondary | ICD-10-CM | POA: Insufficient documentation

## 2023-04-04 DIAGNOSIS — D123 Benign neoplasm of transverse colon: Secondary | ICD-10-CM | POA: Diagnosis not present

## 2023-04-04 HISTORY — PX: POLYPECTOMY: SHX5525

## 2023-04-04 HISTORY — PX: COLONOSCOPY WITH PROPOFOL: SHX5780

## 2023-04-04 SURGERY — COLONOSCOPY WITH PROPOFOL
Anesthesia: General

## 2023-04-04 MED ORDER — PROPOFOL 500 MG/50ML IV EMUL
INTRAVENOUS | Status: DC | PRN
  Administered 2023-04-04: 150 ug/kg/min via INTRAVENOUS

## 2023-04-04 MED ORDER — LACTATED RINGERS IV SOLN
INTRAVENOUS | Status: DC | PRN
Start: 2023-04-04 — End: 2023-04-04

## 2023-04-04 MED ORDER — PROPOFOL 10 MG/ML IV BOLUS
INTRAVENOUS | Status: DC | PRN
  Administered 2023-04-04: 100 mg via INTRAVENOUS

## 2023-04-04 MED ORDER — LIDOCAINE HCL (CARDIAC) PF 100 MG/5ML IV SOSY
PREFILLED_SYRINGE | INTRAVENOUS | Status: DC | PRN
  Administered 2023-04-04: 50 mg via INTRAVENOUS

## 2023-04-04 NOTE — H&P (Signed)
@LOGO @   Primary Care Physician:  Avis Epley, PA-C Primary Gastroenterologist:  Dr. Jena Gauss  Pre-Procedure History & Physical: HPI:  Gregory Quinn is a 62 y.o. male here for   Surveillance colonoscopy.  1.2 cm adenoma removed from his colon 2013; overdue for surveillance examination.  Past Medical History:  Diagnosis Date   Colon polyps    HTN (hypertension)     Past Surgical History:  Procedure Laterality Date   APPENDECTOMY     COLONOSCOPY  12/02/98   normal rectum/single descending colonic diverticulum. Tubular adenoma of distal transverse colon.    COLONOSCOPY  11/16/2011   Procedure: COLONOSCOPY;  Surgeon: Corbin Ade, MD;  Location: [facility];  Service: Endoscopy;  Laterality: N/A;  9:15   SHOULDER SURGERY     TONSILLECTOMY     VASECTOMY      Prior to Admission medications   Medication Sig Start Date End Date Taking? Authorizing Provider  amLODipine (NORVASC) 10 MG tablet Take 10 mg by mouth daily. 01/23/23  Yes [provider]  aspirin EC 81 MG tablet Take 81 mg by mouth daily. Swallow whole.   Yes [provider]  bisoprolol-hydrochlorothiazide (ZIAC) 10-6.25 MG per tablet Take 1 tablet by mouth daily.   Yes [provider]  Cholecalciferol (VITAMIN D) 125 MCG (5000 UT) CAPS Take by mouth daily.   Yes [provider]  Glucosamine-Chondroit-Vit C-Mn (GLUCOSAMINE 1500 COMPLEX PO) Take by mouth. 2 daily   Yes [provider]  Multiple Vitamins-Minerals (CENTRUM SILVER PO) Take by mouth daily.   Yes [provider]  omega-3 acid ethyl esters (LOVAZA) 1 g capsule Take by mouth 2 (two) times daily.   Yes [provider]  atorvastatin (LIPITOR) 10 MG tablet Take 10 mg by mouth at bedtime. 01/23/23   [provider]  ferrous sulfate 325 (65 FE) MG EC tablet Take 325 mg by mouth daily with breakfast.    [provider]    Allergies as of 03/18/2023 - Review Complete 02/08/2023   Allergen Reaction Noted   Oxycodone  02/08/2023    Family History  Problem Relation Age of Onset   Colon cancer Neg Hx    Liver disease Neg Hx    Emphysema Father        deceased   Dementia Mother     Social History   Socioeconomic History   Marital status: Married    Spouse name: Not on file   Number of children: 4   Years of education: Not on file   Highest education level: Not on file  Occupational History   Occupation: Emergency planning/management officer    Comment: [employer]  Tobacco Use   Smoking status: Never   Smokeless tobacco: Never  Vaping Use   Vaping status: Never Used  Substance and Sexual Activity   Alcohol use: No   Drug use: No   Sexual activity: Not on file  Other Topics Concern   Not on file  Social History Narrative   Not on file   Social Determinants of Health   Financial Resource Strain: Not on file  Food Insecurity: Not on file  Transportation Needs: Not on file  Physical Activity: Not on file  Stress: Not on file  Social Connections: Not on file  Intimate Partner Violence: Not on file    Review of Systems: See HPI, otherwise negative ROS  Physical Exam: BP (!) 153/83   Pulse 66   Temp 98.3 F (36.8 C) (Oral)  Resp 16   Ht 5\' 11"  (1.803 m)   Wt 106.6 kg   SpO2 97%   BMI 32.78 kg/m  General:   Alert,  Well-developed, well-nourished, pleasant and cooperative in NAD Lungs:  Clear throughout to auscultation.   No wheezes, crackles, or rhonchi. No acute distress. Heart:  Regular rate and rhythm; no murmurs, clicks, rubs,  or gallops. Abdomen: Non-distended, normal bowel sounds.  Soft and nontender without appreciable mass or hepatosplenomegaly.  Impression/Plan:    62 year old gentleman with a history of multiple colonic adenomas removed over time.  He is far overdue for   A surveillance examination.    I have offered the patient a surveillance colonoscopy today..  The risks, benefits, limitations, alternatives and imponderables have been reviewed  with the patient. Questions have been answered. All parties are agreeable.       Notice: This dictation was prepared with Dragon dictation along with smaller phrase technology. Any transcriptional errors that result from this process are unintentional and may not be corrected upon review.

## 2023-04-04 NOTE — Anesthesia Preprocedure Evaluation (Signed)
Anesthesia Evaluation  Patient identified by MRN, date of birth, ID band Patient awake    Reviewed: Allergy & Precautions, H&P , NPO status , Patient's Chart, lab work & pertinent test results, reviewed documented beta blocker date and time   Airway Mallampati: II  TM Distance: >3 FB Neck ROM: full    Dental no notable dental hx.    Pulmonary neg pulmonary ROS   Pulmonary exam normal breath sounds clear to auscultation       Cardiovascular Exercise Tolerance: Good hypertension, negative cardio ROS  Rhythm:regular Rate:Normal     Neuro/Psych negative neurological ROS  negative psych ROS   GI/Hepatic negative GI ROS, Neg liver ROS,,,  Endo/Other  negative endocrine ROS    Renal/GU negative Renal ROS  negative genitourinary   Musculoskeletal   Abdominal   Peds  Hematology negative hematology ROS (+)   Anesthesia Other Findings   Reproductive/Obstetrics negative OB ROS                             Anesthesia Physical Anesthesia Plan  ASA: 2  Anesthesia Plan: General   Post-op Pain Management:    Induction:   PONV Risk Score and Plan: Propofol infusion  Airway Management Planned:   Additional Equipment:   Intra-op Plan:   Post-operative Plan:   Informed Consent: I have reviewed the patients History and Physical, chart, labs and discussed the procedure including the risks, benefits and alternatives for the proposed anesthesia with the patient or authorized representative who has indicated his/her understanding and acceptance.     Dental Advisory Given  Plan Discussed with: CRNA  Anesthesia Plan Comments:        Anesthesia Quick Evaluation  

## 2023-04-04 NOTE — Anesthesia Procedure Notes (Signed)
Date/Time: 04/04/2023 11:33 AM  Performed by: Julian Reil, CRNAPre-anesthesia Checklist: Patient identified, Emergency Drugs available, Patient being monitored and Suction available Patient Re-evaluated:Patient Re-evaluated prior to induction Oxygen Delivery Method: Nasal cannula Induction Type: IV induction Placement Confirmation: positive ETCO2

## 2023-04-04 NOTE — Transfer of Care (Signed)
Immediate Anesthesia Transfer of Care Note  Patient: Gregory Quinn  Procedure(s) Performed: COLONOSCOPY WITH PROPOFOL POLYPECTOMY  Patient Location: Endoscopy Unit  Anesthesia Type:General  Level of Consciousness: drowsy  Airway & Oxygen Therapy: Patient Spontanous Breathing  Post-op Assessment: Report given to RN and Post -op Vital signs reviewed and stable  Post vital signs: Reviewed and stable  Last Vitals:  Vitals Value Taken Time  BP    Temp    Pulse    Resp    SpO2      Last Pain:  Vitals:   04/04/23 1129  TempSrc:   PainSc: 0-No pain      Patients Stated Pain Goal: 4 (04/04/23 0950)  Complications: No notable events documented.

## 2023-04-04 NOTE — Op Note (Signed)
[facility] Patient Name: Gregory Quinn Procedure Date: 04/04/2023 11:16 AM MRN: [id number] Date of Birth: [birth year] Attending MD: Gennette Pac , MD, [id number] CSN: [id number] Age: 62 Admit Type: Outpatient Procedure:                Colonoscopy Indications:              High risk colon cancer surveillance: Personal                            history of colonic polyps Providers:                Gennette Pac, MD, Buel Ream. Thomasena Edis RN, RN,                            Pandora Leiter, Technician Referring MD:              Medicines:                Propofol per Anesthesia Complications:            No immediate complications. Estimated Blood Loss:     Estimated blood loss was minimal. Procedure:                Pre-Anesthesia Assessment:                           - Prior to the procedure, a History and Physical                            was performed, and patient medications and                            allergies were reviewed. The patient's tolerance of                            previous anesthesia was also reviewed. The risks                            and benefits of the procedure and the sedation                            options and risks were discussed with the patient.                            All questions were answered, and informed consent                            was obtained. Prior Anticoagulants: The patient has                            taken no anticoagulant or antiplatelet agents. ASA                            Grade Assessment: II - A patient with mild systemic  disease. After reviewing the risks and benefits,                            the patient was deemed in satisfactory condition to                            undergo the procedure.                           After obtaining informed consent, the colonoscope                            was passed under direct vision. Throughout the                             procedure, the patient's blood pressure, pulse, and                            oxygen saturations were monitored continuously. The                            [id number]) scope was introduced through the                            anus and advanced to the the cecum, identified by                            appendiceal orifice and ileocecal valve. The                            colonoscopy was performed without difficulty. The                            patient tolerated the procedure well. The quality                            of the bowel preparation was adequate. The                            ileocecal valve, appendiceal orifice, and rectum                            were photographed. The entire colon was well                            visualized. Scope In: 11:33:35 AM Scope Out: 11:47:44 AM Scope Withdrawal Time: 0 hours 12 minutes 2 seconds  Total Procedure Duration: 0 hours 14 minutes 9 seconds  Findings:      The perianal and digital rectal examinations were normal.      A 10 mm polyp was found in the descending colon. The polyp was sessile.       The polyp was removed with a hot snare. Resection and retrieval were       complete. Estimated blood loss: none. Estimated blood loss: none.  A 5 mm polyp was found in the splenic flexure. The polyp was sessile.       The polyp was removed with a cold snare. Resection and retrieval were       complete. Estimated blood loss was minimal.      Non-bleeding internal hemorrhoids were found during retroflexion. The       hemorrhoids were moderate, medium-sized and Grade I (internal       hemorrhoids that do not prolapse).      The exam was otherwise without abnormality on direct and retroflexion       views. Impression:               - One 10 mm polyp in the descending colon, removed                            with a hot snare. Resected and retrieved.                           - One 5 mm polyp at the splenic flexure, removed                             with a cold snare. Resected and retrieved.                           - Non-bleeding internal hemorrhoids.                           - The examination was otherwise normal on direct                            and retroflexion views. Moderate Sedation:      Moderate (conscious) sedation was personally administered by an       anesthesia professional. The following parameters were monitored: oxygen       saturation, heart rate, blood pressure, respiratory rate, EKG, adequacy       of pulmonary ventilation, and response to care. Recommendation:           - Patient has a contact number available for                            emergencies. The signs and symptoms of potential                            delayed complications were discussed with the                            patient. Return to normal activities tomorrow.                            Written discharge instructions were provided to the                            patient.                           - Advance diet as tolerated.                           -  Continue present medications.                           - Repeat colonoscopy date to be determined after                            pending pathology results are reviewed for                            surveillance.                           - Return to GI office (date not yet determined). Procedure Code(s):        --- Professional ---                           [phone], Colonoscopy, flexible; with removal of                            tumor(s), polyp(s), or other lesion(s) by snare                            technique Diagnosis Code(s):        --- Professional ---                           Z86.010, Personal history of colonic polyps                           D12.4, Benign neoplasm of descending colon                           D12.3, Benign neoplasm of transverse colon (hepatic                            flexure or splenic flexure)                           K64.0,  First degree hemorrhoids CPT copyright 2022 [employer]. All rights reserved. The codes documented in this report are preliminary and upon coder review may  be revised to meet current compliance requirements. Gerrit Friends. Kervens Roper, MD Gennette Pac, MD 04/04/2023 11:55:18 AM This report has been signed electronically. Number of Addenda: 0

## 2023-04-04 NOTE — Discharge Instructions (Signed)
  Colonoscopy Discharge Instructions  Read the instructions outlined below and refer to this sheet in the next few weeks. These discharge instructions provide you with general information on caring for yourself after you leave the hospital. Your doctor may also give you specific instructions. While your treatment has been planned according to the most current medical practices available, unavoidable complications occasionally occur. If you have any problems or questions after discharge, call Dr. Jena Gauss at [phone]. ACTIVITY You may resume your regular activity, but move at a slower pace for the next 24 hours.  Take frequent rest periods for the next 24 hours.  Walking will help get rid of the air and reduce the bloated feeling in your belly (abdomen).  No driving for 24 hours (because of the medicine (anesthesia) used during the test).   Do not sign any important legal documents or operate any machinery for 24 hours (because of the anesthesia used during the test).  NUTRITION Drink plenty of fluids.  You may resume your normal diet as instructed by your doctor.  Begin with a light meal and progress to your normal diet. Heavy or fried foods are harder to digest and may make you feel sick to your stomach (nauseated).  Avoid alcoholic beverages for 24 hours or as instructed.  MEDICATIONS You may resume your normal medications unless your doctor tells you otherwise.  WHAT YOU CAN EXPECT TODAY Some feelings of bloating in the abdomen.  Passage of more gas than usual.  Spotting of blood in your stool or on the toilet paper.  IF YOU HAD POLYPS REMOVED DURING THE COLONOSCOPY: No aspirin products for 7 days or as instructed.  No alcohol for 7 days or as instructed.  Eat a soft diet for the next 24 hours.  FINDING OUT THE RESULTS OF YOUR TEST Not all test results are available during your visit. If your test results are not back during the visit, make an appointment with your caregiver to find out the  results. Do not assume everything is normal if you have not heard from your caregiver or the medical facility. It is important for you to follow up on all of your test results.  SEEK IMMEDIATE MEDICAL ATTENTION IF: You have more than a spotting of blood in your stool.  Your belly is swollen (abdominal distention).  You are nauseated or vomiting.  You have a temperature over 101.  You have abdominal pain or discomfort that is severe or gets worse throughout the day.       2 polyps removed from your colon today  Further recommendations to follow pending review of pathology report   at patient request, I called Gregory Quinn at [phone] reviewed findings and recommendations

## 2023-04-05 LAB — SURGICAL PATHOLOGY

## 2023-04-08 ENCOUNTER — Encounter: Payer: Self-pay | Admitting: Internal Medicine

## 2023-04-09 NOTE — Anesthesia Postprocedure Evaluation (Signed)
Anesthesia Post Note  Patient: Gregory Quinn  Procedure(s) Performed: COLONOSCOPY WITH PROPOFOL POLYPECTOMY  Patient location during evaluation: Phase II Anesthesia Type: General Level of consciousness: awake Pain management: pain level controlled Vital Signs Assessment: post-procedure vital signs reviewed and stable Respiratory status: spontaneous breathing and respiratory function stable Cardiovascular status: blood pressure returned to baseline and stable Postop Assessment: no headache and no apparent nausea or vomiting Anesthetic complications: no Comments: Late entry   No notable events documented.   Last Vitals:  Vitals:   04/04/23 1151 04/04/23 1153  BP: 106/77 118/74  Pulse: 61 65  Resp: 17 14  Temp: 36.8 C   SpO2: 93% 95%    Last Pain:  Vitals:   04/04/23 1153  TempSrc:   PainSc: 0-No pain                 Windell Norfolk

## 2023-04-11 ENCOUNTER — Encounter (HOSPITAL_COMMUNITY): Payer: Self-pay | Admitting: Internal Medicine

## 2024-05-18 ENCOUNTER — Telehealth: Payer: Self-pay

## 2024-05-18 NOTE — Telephone Encounter (Signed)
 Copied from CRM [id number]. Topic: Appointments - Transfer of Care >> May 18, 2024 11:28 AM Antony RAMAN wrote: Pt is requesting to transfer FROM: Gregory Quinn Pt is requesting to transfer TO: Gregory Quinn Reason for requested transfer: pt request It is the responsibility of the team the patient would like to transfer to (Dr. aletha) to reach out to the patient if for any reason this transfer is not acceptable.

## 2024-09-14 ENCOUNTER — Ambulatory Visit: Admitting: Family Medicine
